# Patient Record
Sex: Female | Born: 1945 | Race: Black or African American | Hispanic: No | Marital: Single | Smoking: Never smoker
Health system: Southern US, Community
[De-identification: ages and names within clinical notes are randomized; demographics above are authoritative.]

## PROBLEM LIST (undated history)

## (undated) DIAGNOSIS — K219 Gastro-esophageal reflux disease without esophagitis: Secondary | ICD-10-CM

## (undated) DIAGNOSIS — I10 Essential (primary) hypertension: Secondary | ICD-10-CM

## (undated) HISTORY — PX: APPENDECTOMY: SHX54

## (undated) HISTORY — PX: KNEE SURGERY: SHX244

## (undated) HISTORY — PX: JOINT REPLACEMENT: SHX530

## (undated) HISTORY — PX: ABDOMINAL HYSTERECTOMY: SHX81

## (undated) HISTORY — PX: TONSILLECTOMY: SUR1361

## (undated) HISTORY — PX: OTHER SURGICAL HISTORY: SHX169

---

## 1998-08-14 HISTORY — PX: OTHER SURGICAL HISTORY: SHX169

## 2004-10-18 ENCOUNTER — Inpatient Hospital Stay: Payer: Self-pay | Admitting: Internal Medicine

## 2004-12-19 ENCOUNTER — Ambulatory Visit: Payer: Self-pay

## 2006-01-24 ENCOUNTER — Ambulatory Visit: Payer: Self-pay

## 2007-02-07 IMAGING — CR DG HAND COMPLETE 3+V*L*
1 series · 3 of 3 positions shown · non-contrast
Comparison: none

REASON FOR EXAM: NUMBNESS,SHOULDER HAND AND RT FOOT
COMMENTS:

[Series 1: view not recorded · 0.17mm/px · 3 of 3 slices shown]
[im 1/3]
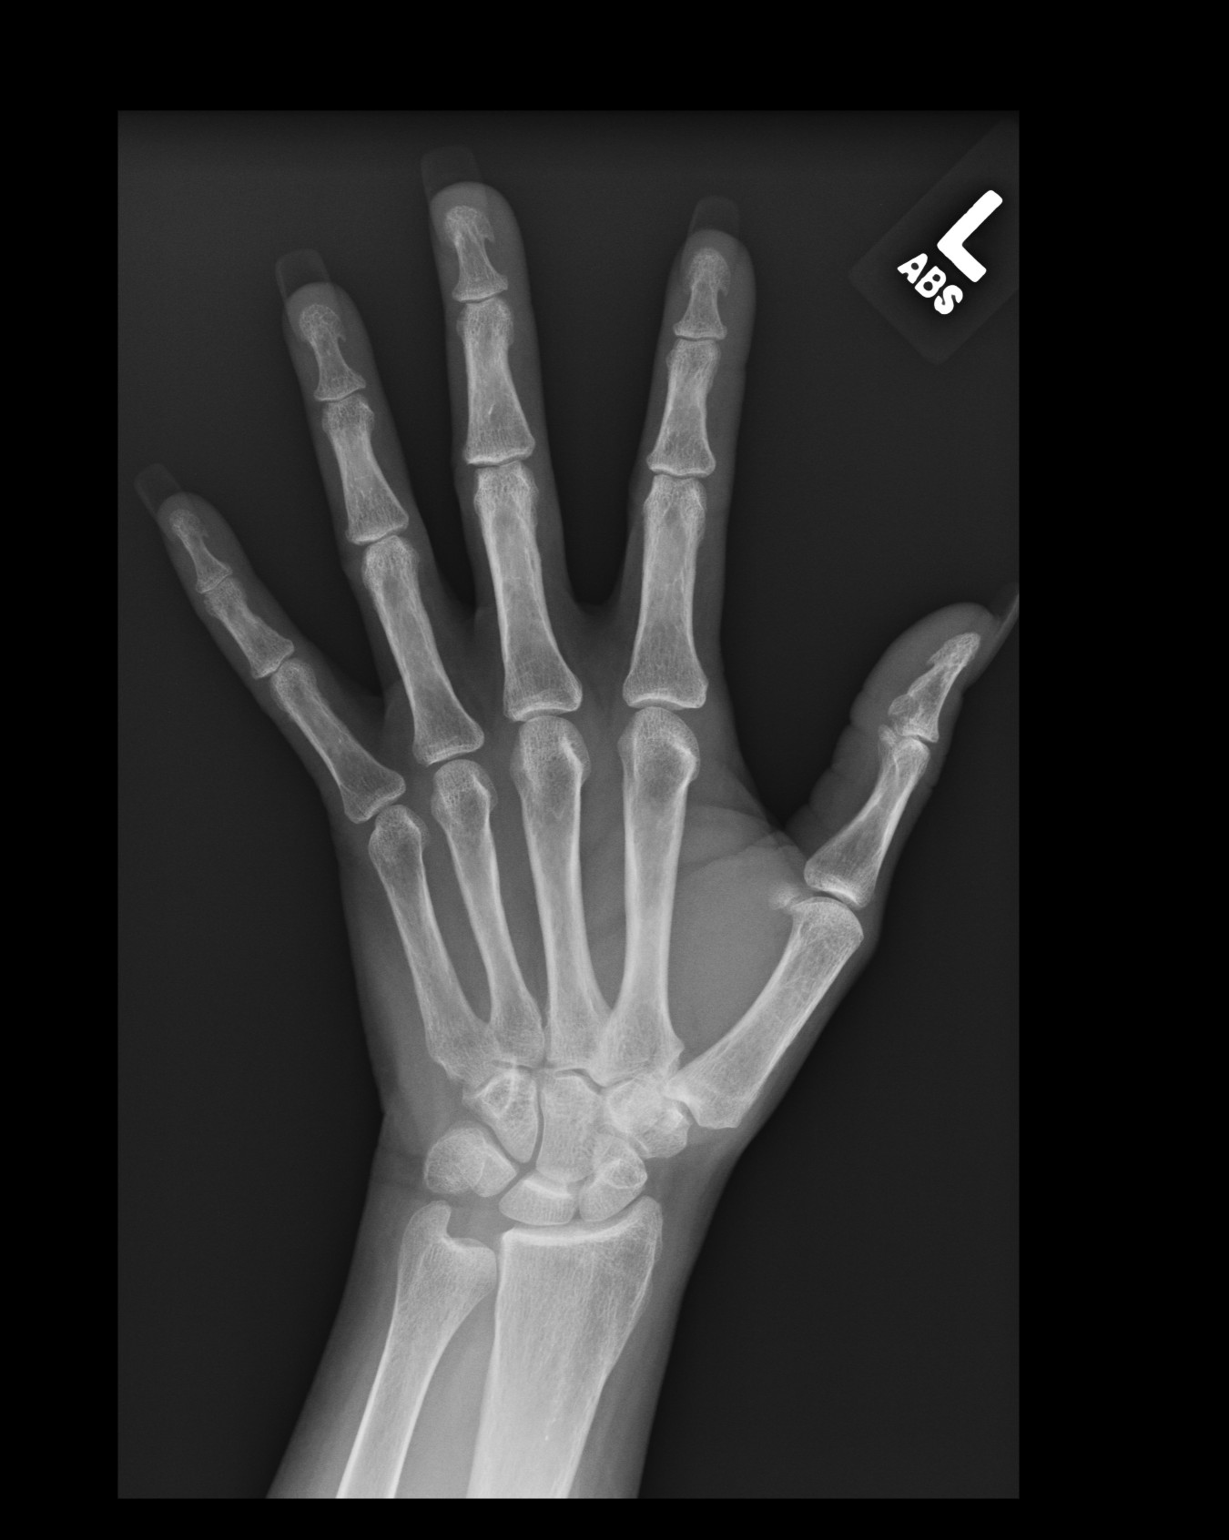
[im 2/3]
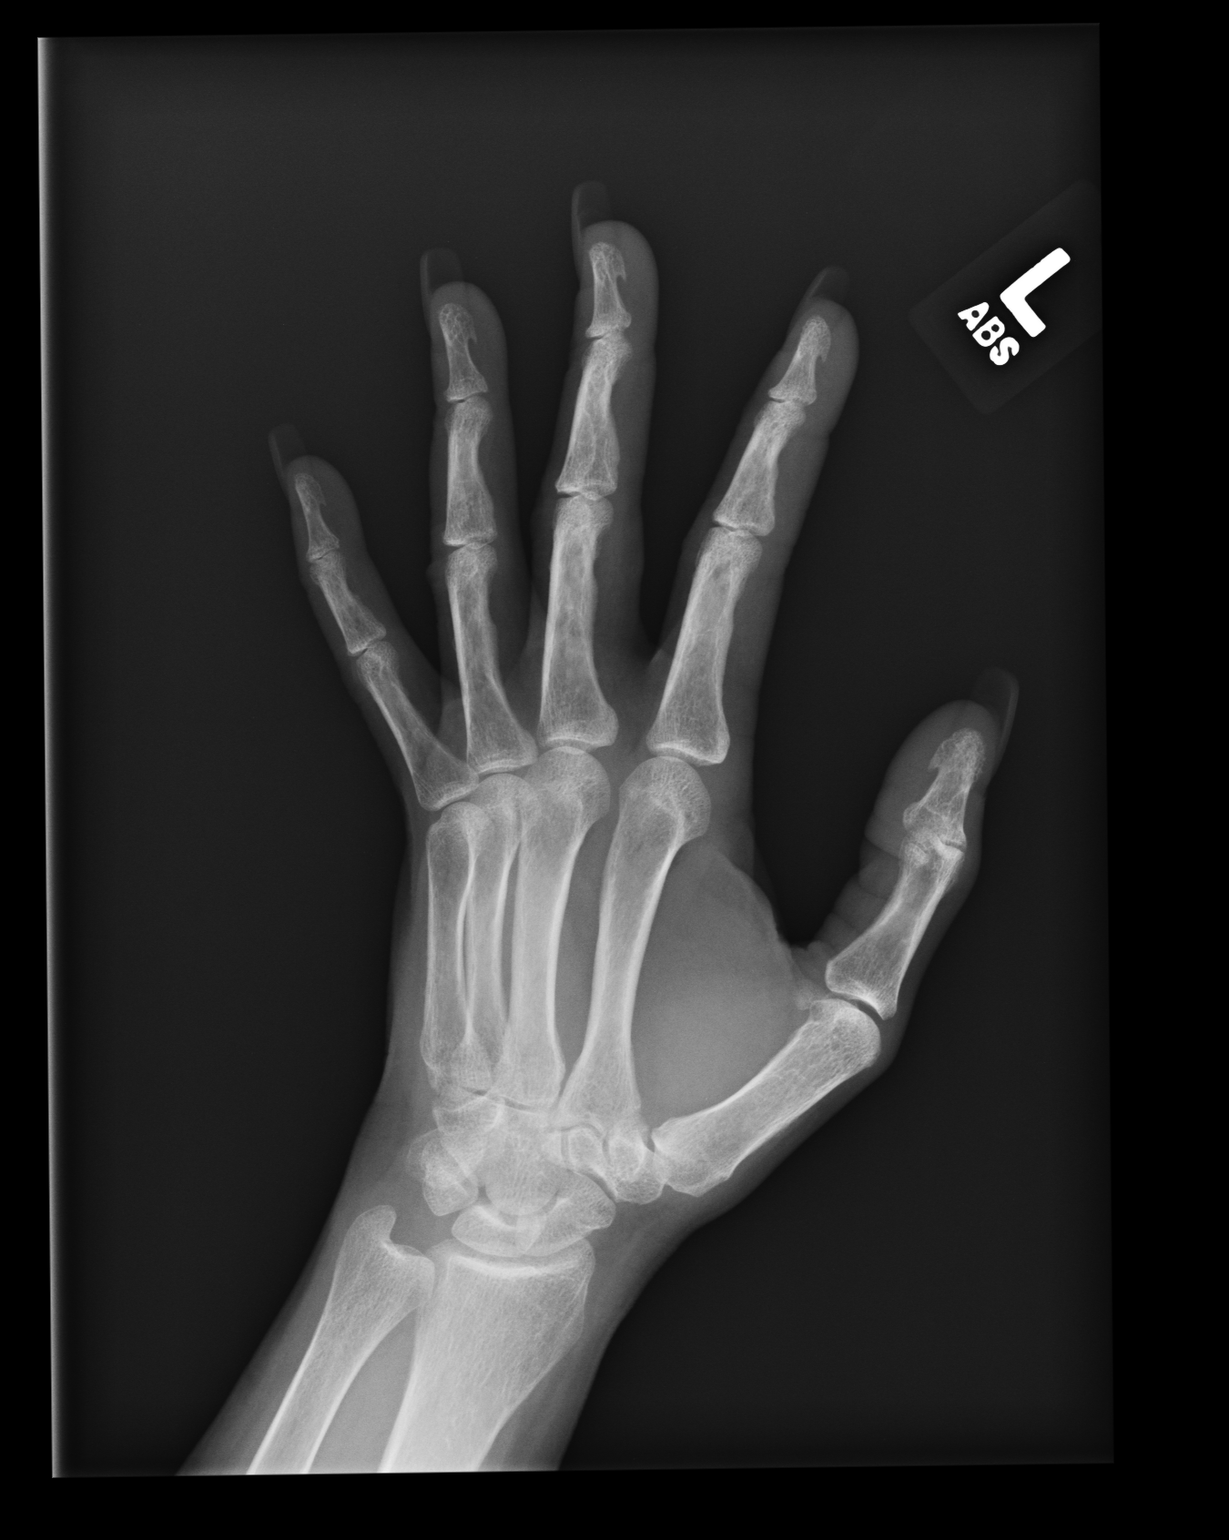
[im 3/3]
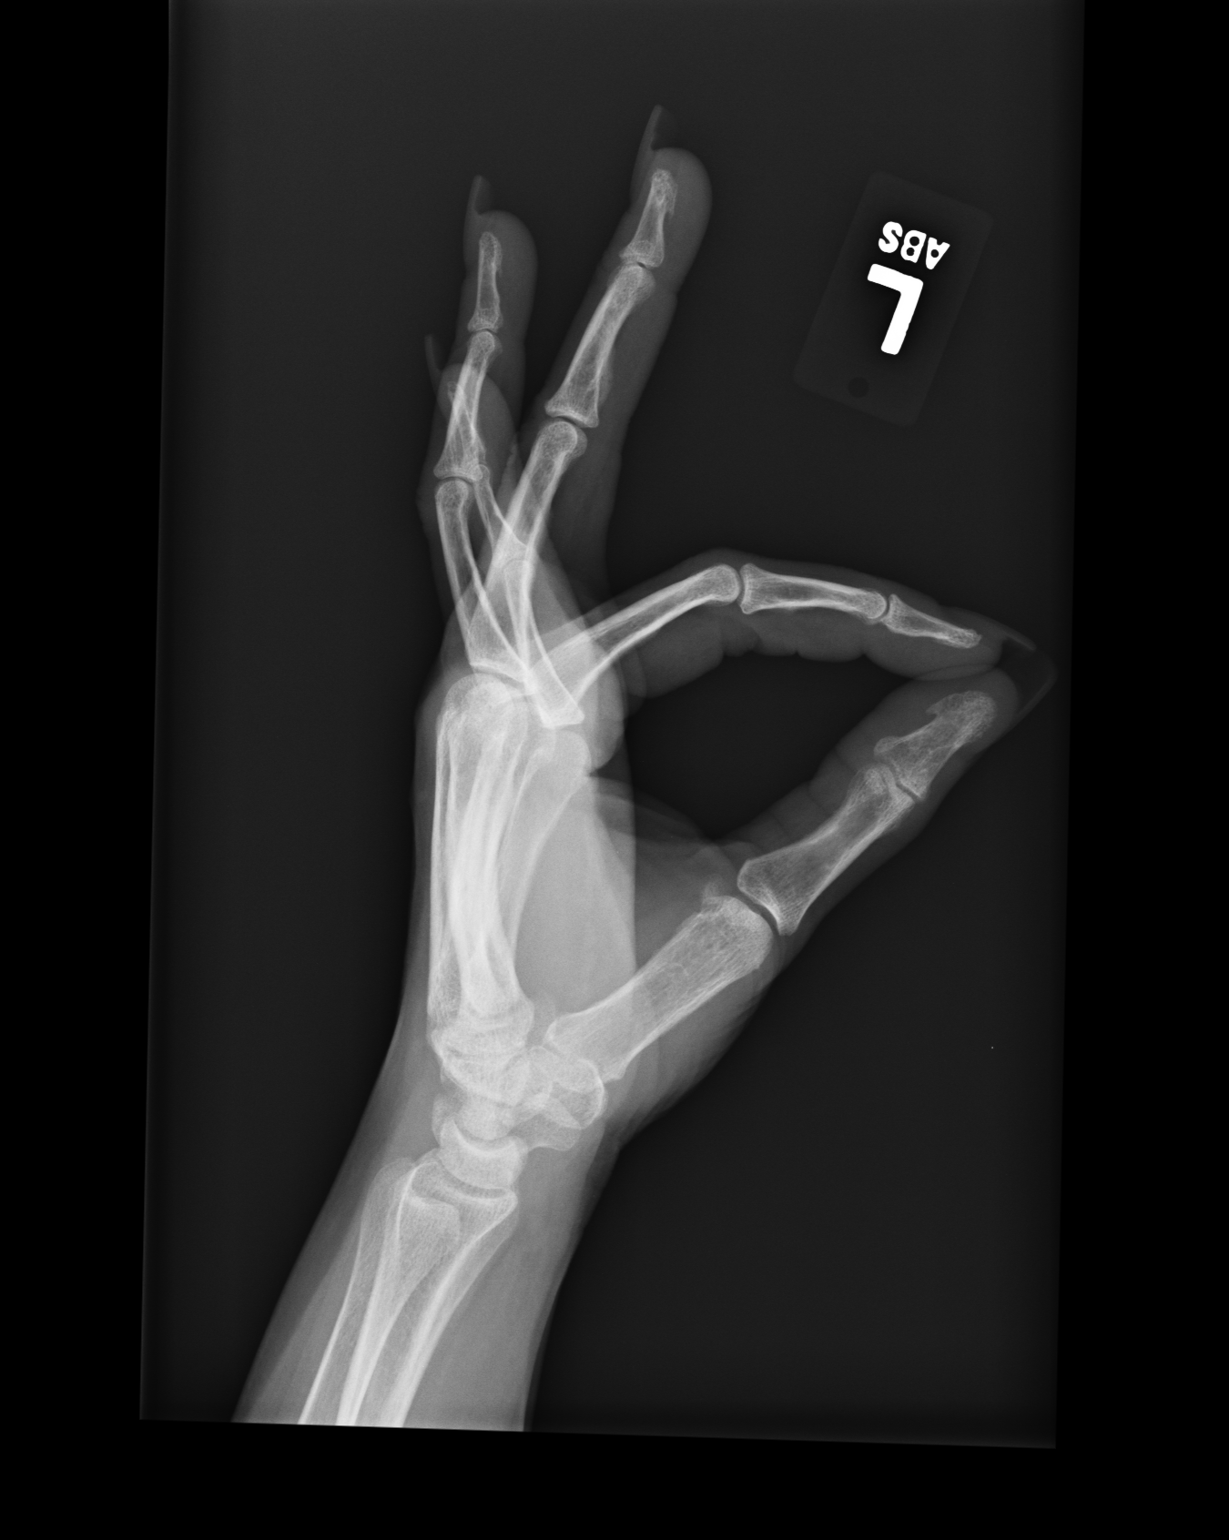

[3 of 3 positions shown; findings below may reference images not displayed]

PROCEDURE:     DXR - DXR HAND LT COMPLETE  W/OBLIQUES  - December 19, 2004 [DATE]

RESULT:        Three views of the LEFT hand reveal the bones to be
adequately mineralized for age.  I see no evidence of acute fracture.  Mild
degenerative joint space narrowing of the interphalangeal joints is seen.
There is no lytic or blastic bony lesion.
IMPRESSION: There are mild age appropriate degenerative changes
present.   I see no acute abnormality.

## 2007-07-02 DIAGNOSIS — K219 Gastro-esophageal reflux disease without esophagitis: Secondary | ICD-10-CM | POA: Insufficient documentation

## 2013-08-14 HISTORY — PX: OTHER SURGICAL HISTORY: SHX169

## 2014-02-11 LAB — CBC WITH AUTOMATED DIFF
ABS. BASOPHILS: 0 10*3/uL (ref 0.0–0.06)
ABS. EOSINOPHILS: 0 10*3/uL (ref 0.0–0.4)
ABS. LYMPHOCYTES: 1.7 10*3/uL (ref 0.9–3.6)
ABS. MONOCYTES: 0.4 10*3/uL (ref 0.05–1.2)
ABS. NEUTROPHILS: 8.6 10*3/uL — ABNORMAL HIGH (ref 1.8–8.0)
BASOPHILS: 0 % (ref 0–2)
EOSINOPHILS: 0 % (ref 0–5)
HCT: 41 % (ref 35.0–45.0)
HGB: 13.7 g/dL (ref 12.0–16.0)
LYMPHOCYTES: 16 % — ABNORMAL LOW (ref 21–52)
MCH: 30.4 PG (ref 24.0–34.0)
MCHC: 33.4 g/dL (ref 31.0–37.0)
MCV: 91.1 FL (ref 74.0–97.0)
MONOCYTES: 4 % (ref 3–10)
MPV: 10.4 FL (ref 9.2–11.8)
NEUTROPHILS: 80 % — ABNORMAL HIGH (ref 40–73)
PLATELET: 236 10*3/uL (ref 135–420)
RBC: 4.5 M/uL (ref 4.20–5.30)
RDW: 14 % (ref 11.6–14.5)
WBC: 10.7 10*3/uL (ref 4.6–13.2)

## 2014-02-11 LAB — URINE MICROSCOPIC ONLY
RBC: 0 /hpf (ref 0–5)
WBC: NEGATIVE /hpf (ref 0–5)

## 2014-02-11 LAB — METABOLIC PANEL, COMPREHENSIVE
A-G Ratio: 0.7 — ABNORMAL LOW (ref 0.8–1.7)
ALT (SGPT): 23 U/L (ref 13–56)
AST (SGOT): 19 U/L (ref 15–37)
Albumin: 3.7 g/dL (ref 3.4–5.0)
Alk. phosphatase: 264 U/L — ABNORMAL HIGH (ref 45–117)
Anion gap: 12 mmol/L (ref 3.0–18)
BUN/Creatinine ratio: 18 (ref 12–20)
BUN: 12 MG/DL (ref 7.0–18)
Bilirubin, total: 0.5 MG/DL (ref 0.2–1.0)
CO2: 25 mmol/L (ref 21–32)
Calcium: 9.8 MG/DL (ref 8.5–10.1)
Chloride: 102 mmol/L (ref 100–108)
Creatinine: 0.67 MG/DL (ref 0.6–1.3)
GFR est AA: >60 mL/min/{1.73_m2} (ref >60)
GFR est non-AA: >60 mL/min/{1.73_m2} (ref >60)
Globulin: 5 g/dL — ABNORMAL HIGH (ref 2.0–4.0)
Glucose: 124 mg/dL — ABNORMAL HIGH (ref 74–99)
Potassium: 4 mmol/L (ref 3.5–5.5)
Protein, total: 8.7 g/dL — ABNORMAL HIGH (ref 6.4–8.2)
Sodium: 139 mmol/L (ref 136–145)

## 2014-02-11 LAB — URINALYSIS W/ RFLX MICROSCOPIC
Bilirubin: NEGATIVE
Glucose: NEGATIVE mg/dL
Ketone: NEGATIVE mg/dL
Leukocyte Esterase: NEGATIVE
Nitrites: NEGATIVE
Protein: >300 mg/dL — AB
Specific gravity: 1.025 (ref 1.003–1.030)
Urobilinogen: 0.2 EU/dL (ref 0.2–1.0)
pH (UA): 7 (ref 5.0–8.0)

## 2014-02-11 LAB — CARDIAC PANEL,(CK, CKMB & TROPONIN)
CK - MB: 0.8 ng/ml (ref 0.5–3.6)
CK-MB Index: 1 % (ref 0.0–4.0)
CK: 80 U/L (ref 26–192)
Troponin-I, QT: <0.02 NG/ML (ref 0.00–0.06)

## 2014-02-11 LAB — LIPASE: Lipase: 157 U/L (ref 73–393)

## 2014-02-11 MED ORDER — PROMETHAZINE 25 MG TAB
25 mg | ORAL_TABLET | Freq: Four times a day (QID) | ORAL | Status: AC | PRN
Start: 2014-02-11 — End: ?

## 2014-02-11 MED ORDER — PROMETHAZINE 25 MG RECTAL SUPPOSITORY
25 mg | Freq: Four times a day (QID) | RECTAL | Status: AC | PRN
Start: 2014-02-11 — End: 2014-02-18

## 2014-02-11 MED ORDER — HYDROCODONE-ACETAMINOPHEN 5 MG-325 MG TAB
5-325 mg | ORAL_TABLET | ORAL | Status: AC | PRN
Start: 2014-02-11 — End: ?

## 2014-02-11 MED ORDER — LABETALOL 5 MG/ML IV SOLN
5 mg/mL | INTRAVENOUS | Status: AC
Start: 2014-02-11 — End: 2014-02-11
  Administered 2014-02-11: 13:00:00 via INTRAVENOUS

## 2014-02-11 MED ORDER — ONDANSETRON (PF) 4 MG/2 ML INJECTION
4 mg/2 mL | INTRAMUSCULAR | Status: AC
Start: 2014-02-11 — End: 2014-02-11
  Administered 2014-02-11: 12:00:00 via INTRAVENOUS

## 2014-02-11 MED ORDER — ONDANSETRON 4 MG TAB, RAPID DISSOLVE
4 mg | ORAL_TABLET | Freq: Three times a day (TID) | ORAL | Status: AC | PRN
Start: 2014-02-11 — End: ?

## 2014-02-11 MED ORDER — FAMOTIDINE (PF) 20 MG/2 ML IV
20 mg/2 mL | INTRAVENOUS | Status: AC
Start: 2014-02-11 — End: 2014-02-11
  Administered 2014-02-11: 12:00:00 via INTRAVENOUS

## 2014-02-11 MED ORDER — RANITIDINE 150 MG TAB
150 mg | ORAL_TABLET | Freq: Two times a day (BID) | ORAL | Status: AC
Start: 2014-02-11 — End: 2014-02-21

## 2014-02-11 MED ADMIN — 0.9% sodium chloride infusion 1,000 mL: INTRAVENOUS | @ 12:00:00 | NDC 00409798309

## 2014-02-11 MED ADMIN — promethazine (PHENERGAN) 12.5 mg in 0.9% sodium chloride 50 mL IVPB: INTRAVENOUS | @ 16:00:00 | NDC 00641092821

## 2014-02-11 MED ADMIN — promethazine (PHENERGAN) 12.5 mg in 0.9% sodium chloride 50 mL IVPB: INTRAVENOUS | @ 14:00:00 | NDC 00641092821

## 2014-02-11 MED FILL — PROMETHAZINE 25 MG/ML INJECTION: 25 mg/mL | INTRAMUSCULAR | Qty: 0.5

## 2014-02-11 MED FILL — LABETALOL 5 MG/ML IV SOLN: 5 mg/mL | INTRAVENOUS | Qty: 20

## 2014-02-11 MED FILL — ONDANSETRON (PF) 4 MG/2 ML INJECTION: 4 mg/2 mL | INTRAMUSCULAR | Qty: 2

## 2014-02-11 MED FILL — SODIUM CHLORIDE 0.9 % IV: INTRAVENOUS | Qty: 1000

## 2014-02-11 MED FILL — FAMOTIDINE (PF) 20 MG/2 ML IV: 20 mg/2 mL | INTRAVENOUS | Qty: 2

## 2014-02-11 NOTE — ED Provider Notes (Signed)
HPI Comments: 7:37 AM   [name] with remote hx of gastric bypass (15 years ago in [place]) who presents to the ED C/O vomiting onset 13 hours ago after eating crab last night. Pt has associated epigastric abdominal pain described as pressure that pt attributes to vomiting episodes. No radiation to back. Reports numerous emesis episodes. + non-bloody diarrhea.  No hematemesis or coffee ground emesis. SurgHx of gastric bypass and hysterectomy. Pt states she has been vomiting on and off ever since her bypass surgery 15 years ago. This occurs 3 times per month. Sxs today are same with no atypical sxs. Pt states she normally will go to ED in [place] and they tx sxs and send patient home. Pt usually takes zofran but pt states she is visiting from [facility] and does not have medication with her. Plans to return home today.  Pt is on medications for her intermittent vomiting. Hx of HTN with last dose of med one day ago. Did not take today. Pt unsure of medication name but has medication in hotel. Pt unsure if she is on PPI or H2 blocker. Takes TUMS prn. No hx of endoscopy for this chronic n/v episodes. No chest pain or SOB.  She denies hx of MI or known cardiac problems and any further symptoms or complaints.     Written by Arvin Collard, ED Scribe, as dictated by Dionne Bucy, PA-C     Patient is a 68 y.o. female presenting with vomiting. The history is provided by the patient. No language interpreter was used.   Vomiting   This is a new problem. The current episode started 12 to 24 hours ago. The problem has not changed since onset.There has been no fever. Associated symptoms include abdominal pain. Pertinent negatives include no chills, no fever, no diarrhea, no headaches, no arthralgias, no cough and no headaches. Her past medical history is significant for gastric bypass.        Past Medical History   Diagnosis Date   ??? Hypertension         Past Surgical History   Procedure Laterality Date   ??? Hx gastric bypass      ??? Hx total abdominal hysterectomy           History reviewed. No pertinent family history.     History     Social History   ??? Marital Status: DIVORCED     Spouse Name: N/A     Number of Children: N/A   ??? Years of Education: N/A     Occupational History   ??? Not on file.     Social History Main Topics   ??? Smoking status: Never Smoker    ??? Smokeless tobacco: Not on file   ??? Alcohol Use: No   ??? Drug Use: Not on file   ??? Sexual Activity: Not on file     Other Topics Concern   ??? Not on file     Social History Narrative   ??? No narrative on file                  ALLERGIES: Percocet      Review of Systems   Constitutional: Positive for appetite change. Negative for fever, chills, fatigue and unexpected weight change.   HENT: Negative for congestion, rhinorrhea and sore throat.    Respiratory: Negative for cough, shortness of breath and wheezing.    Cardiovascular: Negative for chest pain, palpitations and leg swelling.   Gastrointestinal: Positive for  nausea, vomiting and abdominal pain. Negative for diarrhea, constipation and blood in stool.   Genitourinary: Positive for frequency. Negative for dysuria, urgency, hematuria, flank pain and difficulty urinating.   Musculoskeletal: Negative for back pain and arthralgias.   Skin: Negative for rash.   Neurological: Negative for dizziness, syncope, light-headedness and headaches.   Hematological: Negative for adenopathy.       Filed Vitals:    02/11/14 1015 02/11/14 1030 02/11/14 1137 02/11/14 1205   BP: 186/111 188/114 190/109 175/111   Pulse: 87 92 89 94   Temp:       Resp: '18 20 18    ' Height:       Weight:       SpO2: 96% 99% 100%             Physical Exam   Constitutional: She is oriented to person, place, and time. She appears well-developed and well-nourished. No distress.   [name] in NAD. A&Ox4. Empty emesis bag in hand. Daughter at bedside.    HENT:   Head: Normocephalic and atraumatic.   Right Ear: External ear normal.   Left Ear: External ear normal.    Nose: Nose normal.   Mouth/Throat: Oropharynx is clear and moist. No oropharyngeal exudate.   Eyes: Conjunctivae are normal. Right eye exhibits no discharge. Left eye exhibits no discharge. No scleral icterus.   Neck: Normal range of motion. Neck supple.   Cardiovascular: Normal rate, regular rhythm, normal heart sounds and intact distal pulses.  Exam reveals no gallop and no friction rub.    No murmur heard.  Pulmonary/Chest: Effort normal and breath sounds normal. No respiratory distress. She has no wheezes. She has no rales.   Abdominal: Soft. Normal appearance. She exhibits no distension, no ascites and no mass. There is no tenderness. There is no rigidity, no rebound, no guarding, no CVA tenderness, no tenderness at McBurney's point and negative Murphy's sign.   Musculoskeletal: Normal range of motion. She exhibits no edema or tenderness.   Lymphadenopathy:     She has no cervical adenopathy.   Neurological: She is alert and oriented to person, place, and time.   Skin: Skin is warm. No rash noted. She is not diaphoretic.   Psychiatric: She has a normal mood and affect. Judgment normal.   Nursing note and vitals reviewed.     RESULTS:    EKG interpretation: (Preliminary)  NSR at 89 bpm; Possible left atrial enlargement; left ventricular hypertrophy with repolarization abnormality; no previous for comparison  EKG read by Edwena Bunde, MD  at 7:29  Written by Arvin Collard, ED Scribe    No orders to display        Labs Reviewed   URINALYSIS W/ RFLX MICROSCOPIC - Abnormal; Notable for the following:     Protein >300 (*)     Blood TRACE (*)     All other components within normal limits   CBC WITH AUTOMATED DIFF - Abnormal; Notable for the following:     NEUTROPHILS 80 (*)     LYMPHOCYTES 16 (*)     ABS. NEUTROPHILS 8.6 (*)     All other components within normal limits   METABOLIC PANEL, COMPREHENSIVE - Abnormal; Notable for the following:     Glucose 124 (*)     Alk. phosphatase 264 (*)      Protein, total 8.7 (*)     Globulin 5.0 (*)     A-G Ratio 0.7 (*)     All other  components within normal limits   URINE MICROSCOPIC ONLY - Abnormal; Notable for the following:     Bacteria FEW (*)     All other components within normal limits   LIPASE   CARDIAC PANEL,(CK, CKMB & TROPONIN)       Recent Results (from the past 12 hour(s))   EKG, 12 LEAD, INITIAL    Collection Time: 02/11/14  7:29 AM   Result Value Ref Range    Ventricular Rate 89 BPM    Atrial Rate 89 BPM    P-R Interval 156 ms    QRS Duration 100 ms    Q-T Interval 362 ms    QTC Calculation (Bezet) 440 ms    Calculated P Axis 48 degrees    Calculated R Axis -24 degrees    Calculated T Axis 54 degrees    Diagnosis       Normal sinus rhythm  Possible Left atrial enlargement  Left ventricular hypertrophy with repolarization abnormality  Cannot rule out Septal infarct , age undetermined  Abnormal ECG  No previous ECGs available     CBC WITH AUTOMATED DIFF    Collection Time: 02/11/14  7:51 AM   Result Value Ref Range    WBC 10.7 4.6 - 13.2 K/uL    RBC 4.50 4.20 - 5.30 M/uL    HGB 13.7 12.0 - 16.0 g/dL    HCT 41.0 35.0 - 45.0 %    MCV 91.1 74.0 - 97.0 FL    MCH 30.4 24.0 - 34.0 PG    MCHC 33.4 31.0 - 37.0 g/dL    RDW 14.0 11.6 - 14.5 %    PLATELET 236 135 - 420 K/uL    MPV 10.4 9.2 - 11.8 FL    NEUTROPHILS 80 (H) 40 - 73 %    LYMPHOCYTES 16 (L) 21 - 52 %    MONOCYTES 4 3 - 10 %    EOSINOPHILS 0 0 - 5 %    BASOPHILS 0 0 - 2 %    ABS. NEUTROPHILS 8.6 (H) 1.8 - 8.0 K/UL    ABS. LYMPHOCYTES 1.7 0.9 - 3.6 K/UL    ABS. MONOCYTES 0.4 0.05 - 1.2 K/UL    ABS. EOSINOPHILS 0.0 0.0 - 0.4 K/UL    ABS. BASOPHILS 0.0 0.0 - 0.06 K/UL    DF AUTOMATED     METABOLIC PANEL, COMPREHENSIVE    Collection Time: 02/11/14  7:51 AM   Result Value Ref Range    Sodium 139 136 - 145 mmol/L    Potassium 4.0 3.5 - 5.5 mmol/L    Chloride 102 100 - 108 mmol/L    CO2 25 21 - 32 mmol/L    Anion gap 12 3.0 - 18 mmol/L    Glucose 124 (H) 74 - 99 mg/dL    BUN 12 7.0 - 18 MG/DL     Creatinine 0.67 0.6 - 1.3 MG/DL    BUN/Creatinine ratio 18 12 - 20      GFR est AA >60 >60 ml/min/1.81m    GFR est non-AA >60 >60 ml/min/1.776m   Calcium 9.8 8.5 - 10.1 MG/DL    Bilirubin, total 0.5 0.2 - 1.0 MG/DL    ALT 23 13 - 56 U/L    AST 19 15 - 37 U/L    Alk. phosphatase 264 (H) 45 - 117 U/L    Protein, total 8.7 (H) 6.4 - 8.2 g/dL    Albumin 3.7 3.4 - 5.0 g/dL    Globulin 5.0 (  H) 2.0 - 4.0 g/dL    A-G Ratio 0.7 (L) 0.8 - 1.7     LIPASE    Collection Time: 02/11/14  7:51 AM   Result Value Ref Range    Lipase 157 73 - 393 U/L   CARDIAC PANEL,(CK, CKMB & TROPONIN)    Collection Time: 02/11/14  7:51 AM   Result Value Ref Range    CK 80 26 - 192 U/L    CK - MB 0.8 0.5 - 3.6 ng/ml    CK-MB Index 1.0 0.0 - 4.0 %    Troponin-I, Qt. <0.02 0.00 - 0.06 NG/ML   URINALYSIS W/ RFLX MICROSCOPIC    Collection Time: 02/11/14 10:04 AM   Result Value Ref Range    Color YELLOW      Appearance CLEAR      Specific gravity 1.025 1.003 - 1.030      pH (UA) 7.0 5.0 - 8.0      Protein >300 (A) NEG mg/dL    Glucose NEGATIVE  NEG mg/dL    Ketone NEGATIVE  NEG mg/dL    Bilirubin NEGATIVE  NEG      Blood TRACE (A) NEG      Urobilinogen 0.2 0.2 - 1.0 EU/dL    Nitrites NEGATIVE  NEG      Leukocyte Esterase NEGATIVE  NEG     URINE MICROSCOPIC ONLY    Collection Time: 02/11/14 10:04 AM   Result Value Ref Range    WBC NEGATIVE  0 - 5 /hpf    RBC 0 to 3 0 - 5 /hpf    Epithelial cells FEW 0 - 5 /lpf    Bacteria FEW (A) NEG /hpf       MDM  Number of Diagnoses or Management Options  Diagnosis management comments: Viral, food borne, pancreatitis, hepatitis, gastritis, atypical ACS. Doubt AAA       Amount and/or Complexity of Data Reviewed  Clinical lab tests: ordered and reviewed  Tests in the medicine section of CPT??: ordered and reviewed (EKG)  Independent visualization of images, tracings, or specimens: yes (EKG)      MEDICATIONS GIVEN:  Medications   ondansetron (ZOFRAN) injection 4 mg (4 mg IntraVENous Given 02/11/14 0757)    famotidine (PF) (PEPCID) injection 20 mg (20 mg IntraVENous Given 02/11/14 0759)   labetalol (NORMODYNE;TRANDATE) injection 20 mg (20 mg IntraVENous Given 02/11/14 0926)   promethazine (PHENERGAN) 12.5 mg in 0.9% sodium chloride 50 mL IVPB (0 mg IntraVENous IV Completed 02/11/14 1019)   promethazine (PHENERGAN) 12.5 mg in 0.9% sodium chloride 50 mL IVPB (0 mg IntraVENous IV Completed 02/11/14 1206)        Procedures    9:16 AM  Still nauseous. Will given IV phenergan. IV in R AC. Pt did not take BP med today. Will give IV dose of labetalol. Pt denies pain. Labs unremarkable. IVF infusing.     10:50 AM  Pt feeling better with IV Phenergan. Nausea resolved. No emesis in ED. Reassessment of abdomen unchanged. BP improved with IV labetalol in ED. Discussed importance of taking HTN meds when she returns to hotel.     11:41 AM  Pt requests phenergan suppository for home. Will Rx PO, supp, and Zofran ODT. Pt also requests Rx for pain meds.     Work up unremarkable. Non-surgical abdomen/pelvis. Neg Murphy's. No tenderness at McBurney's. Pt reports recurrent n/v since gastric bypass 15 years ago. Presents with same today. Non bloody emesis. No actual emesis in ED. Spitting in  emesis bag. Doubt atypical cardiac. No indication for emergent imaging. Discussed bland diet. Will Rx H2 Blocker as not on antacid. IVF given. Doubt need for emergent imaging as pt reports this is chronic condition occurring 3 times per month for past 15 years. Suspect related to crab meal last night.  FU with PCP upon return home to [facility]. Pt likely will need a non-emergent endoscopy if sxs persist. Pt has known hx of HTN. Did not take BP meds today. Improved with tx in ED. Discussed need for compliance with HTN meds as soon as she returns to hotel. Family plans to drive home today. Reasons to RTED discussed. All questions answered. Pt and her family expressed understanding and they agree with plan.          Chelsea Ellison's  results have been reviewed with her.  She has been counseled regarding her diagnosis, treatment, and plan.  She verbally conveys understanding and agreement of the signs, symptoms, diagnosis, treatment and prognosis and additionally agrees to follow up as discussed.  She also agrees with the care-plan and conveys that all of her questions have been answered.  I have also provided discharge instructions for her that include: educational information regarding their diagnosis and treatment, and list of reasons why they would want to return to the ED prior to their follow-up appointment, should her condition change.      CLINICAL IMPRESSION    1. Nausea with vomiting    2. Diarrhea    3. History of gastric bypass    4. Hypertension, uncontrolled        Follow-up Information    Follow up With Details Comments Contact Info    Your Family Doctor when you get Home       [facility] EMERGENCY DEPT  As needed, If symptoms worsen [address]  [place] [address]  [phone]          Discharge Medication List as of 02/11/2014 10:48 AM      START taking these medications    Details   ondansetron (ZOFRAN ODT) 4 mg disintegrating tablet Take 1 Tab by mouth every eight (8) hours as needed for Nausea., Print, Disp-20 Tab, R-0      promethazine (PHENERGAN) 25 mg tablet Take 1 Tab by mouth every six (6) hours as needed (for nausea or vomiting.)., Print, Disp-20 Tab, R-0      ranitidine (ZANTAC) 150 mg tablet Take 1 Tab by mouth two (2) times a day for 10 days. For acid relux., Print, Disp-20 Tab, R-0         CONTINUE these medications which have NOT CHANGED    Details   potassium chloride SR (KLOR-CON 10) 10 mEq tablet Take 20 mEq by mouth two (2) times a day., Historical Med             Written by Arvin Collard, ED Scribe, as dictated by Dionne Bucy, PA-C     I agree with the above documentation by the scribe.  -- Dionne Bucy, PA-C

## 2014-02-11 NOTE — ED Notes (Signed)
No vomiting noted, patient spitting in emesis bag. Complains of still feeling nauseated. Ambulatory to restroom with minimal assistance.

## 2014-02-11 NOTE — ED Notes (Signed)
Attempted to discharge patient, family member with patient states patient just vomited. Asked was it clear and patient spitting, family member states "no it was vomit" after she asked patient did she vomit. Patient has been spitting in an emesis bag during her entire visit, no actual vomiting noted. Physician assistant made aware of patients "vomiting" episodes. Report given to Cyd SilenceKelly B RN.

## 2014-02-11 NOTE — ED Notes (Signed)
Chelsea IvanLiz RN attempted to discharge patient. This rn was informed that Patient continues to vomit. Chelsea IvanLiz RN informed provider and provider is placing order for more phenergan for vomiting.  Ass[facility] care of [facility].

## 2014-02-11 NOTE — ED Notes (Signed)
"  I have been vomiting since 6pm and I am having pain in my chest from throwing up."

## 2014-02-11 NOTE — ED Notes (Signed)
I have reviewed discharge instructions with the patient.  The patient verbalized understanding.  Patient armband removed and shredded. Pt sister at bedside to drive patient home.

## 2014-02-11 NOTE — ED Notes (Signed)
IV fluids continue to run. Patient reports feeling slightly better, no vomiting noted.

## 2014-02-11 NOTE — ED Notes (Signed)
Patient is a very poor historian, unable to say what surgeries or health problems she has other than she had gastric bypass "many years ago" and has had problems since that time.

## 2014-02-11 NOTE — ED Notes (Signed)
Patient medicated for vomiting. Pt gives this rn permission to speak with her sister who is at the bedside . Pt sister requesting patient to be discharged home with rectal suppository medication for nausea.  Provider made aware and provider approves patient to be discharged with currently vital signs and patient is to take her blood pressure medication at home.

## 2014-02-13 LAB — EKG, 12 LEAD, INITIAL
Atrial Rate: 89 {beats}/min
Calculated P Axis: 48 degrees
Calculated R Axis: -24 degrees
Calculated T Axis: 54 degrees
Diagnosis: NORMAL
P-R Interval: 156 ms
Q-T Interval: 362 ms
QRS Duration: 100 ms
QTC Calculation (Bezet): 440 ms
Ventricular Rate: 89 {beats}/min

## 2014-04-09 ENCOUNTER — Ambulatory Visit: Payer: Self-pay | Admitting: Gastroenterology

## 2014-04-10 LAB — PATHOLOGY REPORT

## 2014-05-26 ENCOUNTER — Ambulatory Visit: Payer: Self-pay | Admitting: Anesthesiology

## 2014-05-26 LAB — CBC WITH DIFFERENTIAL/PLATELET
BASOS ABS: 0 10*3/uL (ref 0.0–0.1)
Basophil %: 0.6 %
EOS ABS: 0.1 10*3/uL (ref 0.0–0.7)
Eosinophil %: 1.6 %
HCT: 35.3 % (ref 35.0–47.0)
HGB: 11.4 g/dL — ABNORMAL LOW (ref 12.0–16.0)
Lymphocyte #: 2.5 10*3/uL (ref 1.0–3.6)
Lymphocyte %: 32.6 %
MCH: 29.9 pg (ref 26.0–34.0)
MCHC: 32.2 g/dL (ref 32.0–36.0)
MCV: 93 fL (ref 80–100)
Monocyte #: 0.5 x10 3/mm (ref 0.2–0.9)
Monocyte %: 6.5 %
NEUTROS ABS: 4.6 10*3/uL (ref 1.4–6.5)
Neutrophil %: 58.7 %
Platelet: 249 10*3/uL (ref 150–440)
RBC: 3.81 10*6/uL (ref 3.80–5.20)
RDW: 14.1 % (ref 11.5–14.5)
WBC: 7.7 10*3/uL (ref 3.6–11.0)

## 2014-05-26 LAB — POTASSIUM: POTASSIUM: 3.9 mmol/L (ref 3.5–5.1)

## 2014-05-29 ENCOUNTER — Ambulatory Visit: Payer: Self-pay | Admitting: Podiatry

## 2014-12-05 NOTE — Op Note (Signed)
PATIENT NAME:  Priscilla Savage, Priscilla Savage MR#:  [id number] DATE OF BIRTH:  1946-07-15  DATE OF PROCEDURE:  05/29/2014  PREOPERATIVE DIAGNOSES:  1. Hammertoe, right second toe. 2. Exostosis, right fifth toe.   POSTOPERATIVE DIAGNOSES:  1. Hammertoe, right second toe. 2.   Exostosis, right fifth toe.  PROCEDURES: 1. Hammertoe correction, right second toe, with K wire fixation.  2. Arthroplasty, right fifth toe.   SURGEON: Linus Galasodd Kristiana Jacko, DPM.  ANESTHESIA: Local MAC.   HEMOSTASIS: Pneumatic tourniquet, right ankle, 250 mmHg.   ESTIMATED BLOOD LOSS: Minimal.   MATERIALS: One 0.045 inch K wire.   COMPLICATIONS: None apparent.   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 She elects for surgical correction.   OPERATIVE PROCEDURE: The patient was taken to the operating room and placed on the table in the supine position. Following satisfactory sedation, the right foot was anesthetized with 10 mL of 0.5% bupivacaine plain around the second and fifth toe areas as well as the second metatarsal. A pneumatic tourniquet was applied at the level of the right ankle and the foot was prepped and draped in the usual sterile fashion. The foot was exsanguinated and the tourniquet inflated to 250 mmHg. Attention was then directed to the dorsal aspect of the right foot, where an approximate 5 cm linear incision was made coursing distal to proximal over the second toe and second metatarsal. The incision was deepened via sharp and blunt dissection down to the level of the joint at the proximal interphalangeal joint as well as the metatarsophalangeal joint. The extensor hood apparatus was released from the joint and a dorsal capsulotomy was performed. There still noted to be some contractures at the metatarsophalangeal joint and extensor tendon Z-lengthening was performed.   At this point, attention was directed distally where a transverse tenotomy was performed at  the proximal interphalangeal joint and the capsular and periosteal tissues reflected off the head of the proximal phalanx, which was then resected in toto using a pneumatic saw. At this point, there was noted to be good release of the contractures on the second toe. The wound was flushed with copious amounts of sterile saline. A 0.045 inch K wire was then driven through the middle and distal phalanx, through the tip of the toe and then retrograded through the proximal phalanx and crossing into the second metatarsal. Intraoperative FluoroScan views revealed good alignment of the second toe and K wire placement. The wound was again flushed with copious amounts of sterile saline and closed using 4-0 Vicryl running suture for all layers from capsular and periosteal tissues to deep and superficial subcutaneous and skin closure. Three 5-0 nylon simple interrupted sutures were then placed over the toe as well.    Attention was then directed to the right fifth toe, where an approximate 1.5 cm linear incision was made over the proximal interphalangeal joint of the fifth toe. The incision was deepened via sharp and blunt dissection down to the level of the joint, where a transverse tenotomy was performed. The capsular and periosteal tissues reflected off the head of the proximal phalanx, which was then resected in toto. Intraoperative FluoroScan views revealed good reduction of the deformity. The wound was flushed with copious amounts of sterile saline and closed using 4-0 Vicryl horizontal mattress for tendon reapproximation followed by skin closure using 5-0 nylon simple interrupted sutures. Tincture of benzoin and Steri-Strips were applied to the second metatarsal incision followed by Xeroform and sterile bandages to  both incisions and the foot.   The tourniquet was released and blood flow noted to return immediately to the left foot in digits 1 through 5. The patient tolerated the procedure and anesthesia well and was  transported to the PACU with vital signs stable and in good condition.     ____________________________ Linus Galas, DPM tc:TT D: 05/29/2014 17:17:31 ET T: 05/29/2014 18:17:09 ET JOB#: [id number]  cc: Linus Galas, DPM, <Dictator> Javari Bufkin DPM ELECTRONICALLY SIGNED 06/08/2014 11:05

## 2017-06-28 DIAGNOSIS — Z9884 Bariatric surgery status: Secondary | ICD-10-CM | POA: Insufficient documentation

## 2017-10-10 DIAGNOSIS — E559 Vitamin D deficiency, unspecified: Secondary | ICD-10-CM | POA: Insufficient documentation

## 2017-10-10 DIAGNOSIS — E538 Deficiency of other specified B group vitamins: Secondary | ICD-10-CM | POA: Insufficient documentation

## 2018-01-10 DIAGNOSIS — E042 Nontoxic multinodular goiter: Secondary | ICD-10-CM | POA: Insufficient documentation

## 2018-03-04 DIAGNOSIS — R440 Auditory hallucinations: Secondary | ICD-10-CM | POA: Insufficient documentation

## 2018-03-08 ENCOUNTER — Other Ambulatory Visit: Payer: Self-pay | Admitting: Neurology

## 2018-03-08 DIAGNOSIS — R440 Auditory hallucinations: Secondary | ICD-10-CM

## 2018-03-21 ENCOUNTER — Ambulatory Visit
Admission: RE | Admit: 2018-03-21 | Discharge: 2018-03-21 | Disposition: A | Discharge status: Home / Self Care | Payer: Medicare HMO | Source: Ambulatory Visit | Attending: Neurology | Admitting: Neurology

## 2018-03-21 ENCOUNTER — Encounter: Payer: Self-pay | Admitting: Radiology

## 2018-03-21 DIAGNOSIS — R440 Auditory hallucinations: Secondary | ICD-10-CM

## 2018-03-21 DIAGNOSIS — R200 Anesthesia of skin: Secondary | ICD-10-CM | POA: Diagnosis present

## 2018-03-21 DIAGNOSIS — I6782 Cerebral ischemia: Secondary | ICD-10-CM | POA: Insufficient documentation

## 2018-03-21 MED ORDER — GADOBENATE DIMEGLUMINE 529 MG/ML IV SOLN
20.0000 mL | Freq: Once | INTRAVENOUS | Status: AC | PRN
  Administered 2018-03-21: 20 mL via INTRAVENOUS

## 2020-04-22 DIAGNOSIS — M179 Osteoarthritis of knee, unspecified: Secondary | ICD-10-CM | POA: Insufficient documentation

## 2020-04-22 DIAGNOSIS — E213 Hyperparathyroidism, unspecified: Secondary | ICD-10-CM | POA: Insufficient documentation

## 2020-12-02 HISTORY — PX: TOTAL KNEE ARTHROPLASTY: SHX125

## 2021-07-14 DIAGNOSIS — R42 Dizziness and giddiness: Secondary | ICD-10-CM

## 2021-07-14 HISTORY — DX: Dizziness and giddiness: R42

## 2021-09-12 ENCOUNTER — Ambulatory Visit: Payer: Self-pay | Admitting: "Endocrinology

## 2022-06-26 ENCOUNTER — Encounter: Payer: Self-pay | Admitting: Orthopedic Surgery

## 2022-07-20 ENCOUNTER — Encounter: Payer: Self-pay | Admitting: Orthopedic Surgery

## 2022-07-24 ENCOUNTER — Encounter: Payer: Self-pay | Admitting: Orthopedic Surgery

## 2022-07-24 ENCOUNTER — Other Ambulatory Visit: Payer: Self-pay | Admitting: Orthopedic Surgery

## 2022-07-24 ENCOUNTER — Ambulatory Visit (INDEPENDENT_AMBULATORY_CARE_PROVIDER_SITE_OTHER): Payer: Medicare HMO | Admitting: Orthopedic Surgery

## 2022-07-24 ENCOUNTER — Ambulatory Visit (INDEPENDENT_AMBULATORY_CARE_PROVIDER_SITE_OTHER): Payer: Medicare HMO

## 2022-07-24 VITALS — BP 180/105 | HR 80 | Ht 67.0 in | Wt 212.0 lb

## 2022-07-24 DIAGNOSIS — M25562 Pain in left knee: Secondary | ICD-10-CM | POA: Diagnosis not present

## 2022-07-24 DIAGNOSIS — Z96652 Presence of left artificial knee joint: Secondary | ICD-10-CM

## 2022-07-24 DIAGNOSIS — G8929 Other chronic pain: Secondary | ICD-10-CM

## 2022-07-24 DIAGNOSIS — I10 Essential (primary) hypertension: Secondary | ICD-10-CM | POA: Insufficient documentation

## 2022-07-24 NOTE — Patient Instructions (Addendum)
Reminder: This is a second opinion only.  Will not be taking over care for her current problem.  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 Romeo Apple will call you with the results of the blood work  We will make the referral for the revision specialist

## 2022-07-24 NOTE — Progress Notes (Signed)
Chief Complaint  Patient presents with   Knee Pain    Left second opinion DOS 12/02/20    HPI: This is a second opinion regarding the left total knee performed in [place] on December 02, 2020  I have notes from Dr. Leary Roca which indicated this is a tune DePuy total knee with an 8 femur right tibia 6 polyethylene PS rotating platform total knee  The patient presented with images from the left knee this looks like a DePuy left total knee with images showing tilt of the patella otherwise normal alignment and no loosening these x-rays are  dated January 27, 2021  She reports 9 out of 10 pain  She notes that she could bend her knee further than she could bend it now prior to the surgery  She reports that the knee gave out prior to surgery she was having pain and she was having difficulty walking and some stiffness  She was active and scuba diving and dancing  She complains that she cannot lift her leg enough to get into her shower and she can no longer swim or dance   Notes from primary care indicate the knee was injected on May 15, 2022   She listed her review of systems as the following pain in legs after walking, urgency, joint pain, depression, easy bruising, denies any back pain pre or postsurgery   Listed medical problems include hyperparathyroidism, hypertension, status post bariatric surgery, vitamin D deficiency, B12 deficiency and esophageal reflux     Current medications are amlodipine 10 mg hydrochlorothiazide 12.5 mg Cozaar 100 mg multivitamin and Ditropan XL once a day 5 mg for 90 days   PHYSICAL EXAM  BP (!) 180/105   Pulse 80   Ht 5\' 7"  (1.702 m)   Wt 212 lb (96.2 kg)   BMI 33.20 kg/m    General appearance: Well-developed well-nourished no gross deformities  Cardiovascular normal pulse and perfusion normal color;   with MILD PRE tibial edema  Neurologically no sensation loss or deficits or pathologic reflexes  Psychological: Awake alert and  oriented x3 mood and affect normal  Skin no lacerations or ulcerations no nodularity no palpable masses, no erythema or nodularity  Musculoskeletal: The patient is ambulatory with a walker but she has decreased stride length and decreased cadence  She has an adequate flexion of the joint  The incision healed well.  She is globally tender the knee is warm to touch.  Her range of motion is 0-60 passive and 0-60 active  She can extend the knee to full extension on her own    Imaging the previous films noted in the history and physical show no malalignment and no evidence of loosening  Today's imaging shows  A/P  Differential diagnoses arthrofibrosis  Infection  Unbalanced flexion extension gaps  Recommend blood work CBC sed rate C-reactive protein rule out infection.  There is no effusion so no aspiration was done  The patient should be referred to revision specialist for possible revision left total knee  Addendum the patient's surgical notes indicate that an 11 mm distal femur resection 5 degree valgus alignment and 4 mm medial tibial resection was performed

## 2022-07-25 LAB — CBC WITH DIFFERENTIAL/PLATELET
Absolute Monocytes: 328 cells/uL (ref 200–950)
Basophils Absolute: 19 cells/uL (ref 0–200)
Basophils Relative: 0.3 %
Eosinophils Absolute: 32 cells/uL (ref 15–500)
Eosinophils Relative: 0.5 %
HCT: 34.3 % — ABNORMAL LOW (ref 35.0–45.0)
Hemoglobin: 11.1 g/dL — ABNORMAL LOW (ref 11.7–15.5)
Lymphs Abs: 2419 cells/uL (ref 850–3900)
MCH: 30.2 pg (ref 27.0–33.0)
MCHC: 32.4 g/dL (ref 32.0–36.0)
MCV: 93.2 fL (ref 80.0–100.0)
MPV: 10.7 fL (ref 7.5–12.5)
Monocytes Relative: 5.2 %
Neutro Abs: 3503 cells/uL (ref 1500–7800)
Neutrophils Relative %: 55.6 %
Platelets: 245 10*3/uL (ref 140–400)
RBC: 3.68 10*6/uL — ABNORMAL LOW (ref 3.80–5.10)
RDW: 12.6 % (ref 11.0–15.0)
Total Lymphocyte: 38.4 %
WBC: 6.3 10*3/uL (ref 3.8–10.8)

## 2022-07-25 LAB — C-REACTIVE PROTEIN: CRP: 3 mg/L (ref <8.0)

## 2022-07-25 LAB — SEDIMENTATION RATE: Sed Rate: 34 mm/h — ABNORMAL HIGH (ref 0–30)

## 2022-07-27 ENCOUNTER — Telehealth: Payer: Self-pay | Admitting: Orthopedic Surgery

## 2022-07-27 NOTE — Telephone Encounter (Signed)
CBC sed rate C-reactive protein does not indicate likely infection  Results relayed to the patient  Request has been put in for patient to see joint replacement specialist for possible revision

## 2022-08-01 ENCOUNTER — Telehealth: Payer: Self-pay | Admitting: Radiology

## 2022-08-01 DIAGNOSIS — G8929 Other chronic pain: Secondary | ICD-10-CM

## 2022-08-01 NOTE — Telephone Encounter (Signed)
Patient asking about referral Where are you referring?

## 2022-08-01 NOTE — Telephone Encounter (Signed)
Patient called and I could not understand the VM.  I think she is asking about a referral to another doctor.  Asks for a call, thanks.

## 2022-08-02 ENCOUNTER — Telehealth: Payer: Self-pay | Admitting: Radiology

## 2022-08-02 ENCOUNTER — Encounter: Payer: Self-pay | Admitting: Orthopedic Surgery

## 2022-08-02 NOTE — Telephone Encounter (Signed)
I will send to [facility] and ask for revision specialist. Thanks

## 2022-08-02 NOTE — Telephone Encounter (Signed)
  Recommend blood work CBC sed rate C-reactive protein rule out infection.  There is no effusion so no aspiration was done  The patient should be referred to revision specialist for possible revision left total knee  Addendum the patient's surgical notes indicate that an 11 mm distal femur resection 5 degree valgus alignment and 4 mm medial tibial resection was performed

## 2022-08-02 NOTE — Addendum Note (Signed)
Addended byCaffie Damme on: 08/02/2022 08:20 AM   Modules accepted: Orders

## 2022-08-02 NOTE — Telephone Encounter (Signed)
Dr Romeo Apple had me refer for second opinion I will send records from here to [facility] she will need to have her surgeon send records as well, Dr Romeo Apple did not do the surgery  I called her to let her know.   [phone] is the fax number [phone] is the phone number I gave her the information and she will have records from [place] sent over and she will call for appointment if she has not heard anything in the next week.

## 2022-10-12 ENCOUNTER — Encounter: Payer: Self-pay | Admitting: Radiology

## 2022-10-30 ENCOUNTER — Encounter: Payer: Self-pay | Admitting: Ophthalmology

## 2022-11-02 NOTE — Discharge Instructions (Signed)

## 2022-11-06 ENCOUNTER — Ambulatory Visit: Payer: 59 | Admitting: Anesthesiology

## 2022-11-06 ENCOUNTER — Other Ambulatory Visit: Payer: Self-pay

## 2022-11-06 ENCOUNTER — Encounter
Admission: RE | Disposition: A | Discharge status: Home / Self Care | Payer: Self-pay | Source: Home / Self Care | Attending: Ophthalmology

## 2022-11-06 ENCOUNTER — Encounter: Payer: Self-pay | Admitting: Ophthalmology

## 2022-11-06 ENCOUNTER — Ambulatory Visit
Admission: RE | Admit: 2022-11-06 | Discharge: 2022-11-06 | Disposition: A | Discharge status: Home / Self Care | Payer: 59 | Attending: Ophthalmology | Admitting: Ophthalmology

## 2022-11-06 DIAGNOSIS — K219 Gastro-esophageal reflux disease without esophagitis: Secondary | ICD-10-CM | POA: Diagnosis not present

## 2022-11-06 DIAGNOSIS — I10 Essential (primary) hypertension: Secondary | ICD-10-CM | POA: Insufficient documentation

## 2022-11-06 DIAGNOSIS — H2511 Age-related nuclear cataract, right eye: Secondary | ICD-10-CM | POA: Insufficient documentation

## 2022-11-06 DIAGNOSIS — Z79899 Other long term (current) drug therapy: Secondary | ICD-10-CM | POA: Diagnosis not present

## 2022-11-06 DIAGNOSIS — Z9884 Bariatric surgery status: Secondary | ICD-10-CM | POA: Insufficient documentation

## 2022-11-06 HISTORY — PX: CATARACT EXTRACTION W/PHACO: SHX586

## 2022-11-06 HISTORY — DX: Essential (primary) hypertension: I10

## 2022-11-06 HISTORY — DX: Gastro-esophageal reflux disease without esophagitis: K21.9

## 2022-11-06 SURGERY — PHACOEMULSIFICATION, CATARACT, WITH IOL INSERTION
Anesthesia: Monitor Anesthesia Care | Site: Eye | Laterality: Right

## 2022-11-06 MED ORDER — TETRACAINE HCL 0.5 % OP SOLN
1.0000 [drp] | OPHTHALMIC | Status: DC | PRN
  Administered 2022-11-06 (×3): 1 [drp] via OPHTHALMIC

## 2022-11-06 MED ORDER — LIDOCAINE HCL (PF) 2 % IJ SOLN
INTRAOCULAR | Status: DC | PRN
  Administered 2022-11-06: 1 mL via INTRAOCULAR

## 2022-11-06 MED ORDER — SIGHTPATH DOSE#1 BSS IO SOLN
INTRAOCULAR | Status: DC | PRN
  Administered 2022-11-06: 15 mL

## 2022-11-06 MED ORDER — ARMC OPHTHALMIC DILATING DROPS
1.0000 | OPHTHALMIC | Status: DC | PRN
  Administered 2022-11-06 (×3): 1 via OPHTHALMIC

## 2022-11-06 MED ORDER — MIDAZOLAM HCL 2 MG/2ML IJ SOLN
INTRAMUSCULAR | Status: DC | PRN
  Administered 2022-11-06 (×2): 1 mg via INTRAVENOUS

## 2022-11-06 MED ORDER — BRIMONIDINE TARTRATE-TIMOLOL 0.2-0.5 % OP SOLN
OPHTHALMIC | Status: DC | PRN
  Administered 2022-11-06: 1 [drp] via OPHTHALMIC

## 2022-11-06 MED ORDER — SIGHTPATH DOSE#1 BSS IO SOLN
INTRAOCULAR | Status: DC | PRN
  Administered 2022-11-06: 70 mL via OPHTHALMIC

## 2022-11-06 MED ORDER — LACTATED RINGERS IV SOLN: INTRAVENOUS | Status: DC

## 2022-11-06 MED ORDER — FENTANYL CITRATE (PF) 100 MCG/2ML IJ SOLN
INTRAMUSCULAR | Status: DC | PRN
  Administered 2022-11-06: 50 ug via INTRAVENOUS

## 2022-11-06 MED ORDER — SIGHTPATH DOSE#1 NA HYALUR & NA CHOND-NA HYALUR IO KIT
PACK | INTRAOCULAR | Status: DC | PRN
  Administered 2022-11-06: 1 via OPHTHALMIC

## 2022-11-06 MED ORDER — MOXIFLOXACIN HCL 0.5 % OP SOLN
OPHTHALMIC | Status: DC | PRN
  Administered 2022-11-06: .2 mL via OPHTHALMIC

## 2022-11-06 SURGICAL SUPPLY — 22 items
CANNULA ANT/CHMB 27G (MISCELLANEOUS) IMPLANT
CANNULA ANT/CHMB 27GA (MISCELLANEOUS) IMPLANT
CATARACT SUITE SIGHTPATH (MISCELLANEOUS) ×1 IMPLANT
DISSECTOR HYDRO NUCLEUS 50X22 (MISCELLANEOUS) ×1 IMPLANT
DRSG TEGADERM 2-3/8X2-3/4 SM (GAUZE/BANDAGES/DRESSINGS) ×1 IMPLANT
FEE CATARACT SUITE SIGHTPATH (MISCELLANEOUS) ×1 IMPLANT
GLOVE SURG SYN 7.5  E (GLOVE) ×1
GLOVE SURG SYN 7.5 E (GLOVE) ×1 IMPLANT
GLOVE SURG SYN 7.5 PF PI (GLOVE) ×1 IMPLANT
GLOVE SURG SYN 8.5  E (GLOVE) ×1
GLOVE SURG SYN 8.5 E (GLOVE) ×1 IMPLANT
GLOVE SURG SYN 8.5 PF PI (GLOVE) ×1 IMPLANT
LENS IOL TECNIS EYHANCE 22.5 (Intraocular Lens) IMPLANT
NDL FILTER BLUNT 18X1 1/2 (NEEDLE) IMPLANT
NEEDLE FILTER BLUNT 18X1 1/2 (NEEDLE) IMPLANT
PACK VIT ANT 23G (MISCELLANEOUS) IMPLANT
RING MALYGIN 7.0 (MISCELLANEOUS) IMPLANT
SUT ETHILON 10-0 CS-B-6CS-B-6 (SUTURE)
SUTURE EHLN 10-0 CS-B-6CS-B-6 (SUTURE) IMPLANT
SYR 3ML LL SCALE MARK (SYRINGE) IMPLANT
SYR 5ML LL (SYRINGE) IMPLANT
WATER STERILE IRR 250ML POUR (IV SOLUTION) ×1 IMPLANT

## 2022-11-06 NOTE — Anesthesia Preprocedure Evaluation (Signed)
Anesthesia Evaluation  Patient identified by MRN, date of birth, ID band Patient awake    Reviewed: Allergy & Precautions, NPO status , Patient's Chart, lab work & pertinent test results  Airway Mallampati: III  TM Distance: >3 FB Neck ROM: full    Dental  (+) Chipped, Dental Advidsory Given   Pulmonary neg pulmonary ROS, neg COPD   Pulmonary exam normal        Cardiovascular hypertension, On Medications negative cardio ROS Normal cardiovascular exam     Neuro/Psych negative neurological ROS  negative psych ROS   GI/Hepatic Neg liver ROS,GERD  Medicated,,  Endo/Other  negative endocrine ROS    Renal/GU      Musculoskeletal   Abdominal   Peds  Hematology negative hematology ROS (+)   Anesthesia Other Findings Past Medical History: No date: GERD (gastroesophageal reflux disease) No date: Hypertension 07/2021: Vertigo     Comment:  had epley Maneuver.  None since  Past Surgical History: No date: ABDOMINAL HYSTERECTOMY No date: APPENDECTOMY No date: eyelid surgery; Bilateral No date: JOINT REPLACEMENT No date: KNEE SURGERY No date: ovariectomy 2015: right foot surgery for overlapping toes 2000: stomach gastric bypass No date: TONSILLECTOMY 12/02/2020: TOTAL KNEE ARTHROPLASTY; Left  BMI    Body Mass Index: 34.39 kg/m      Reproductive/Obstetrics negative OB ROS                             Anesthesia Physical Anesthesia Plan  ASA: 2  Anesthesia Plan: MAC   Post-op Pain Management:    Induction: Intravenous  PONV Risk Score and Plan:   Airway Management Planned: Natural Airway and Nasal Cannula  Additional Equipment:   Intra-op Plan:   Post-operative Plan:   Informed Consent: I have reviewed the patients History and Physical, chart, labs and discussed the procedure including the risks, benefits and alternatives for the proposed anesthesia with the patient or  authorized representative who has indicated his/her understanding and acceptance.     Dental Advisory Given  Plan Discussed with: Anesthesiologist, CRNA and Surgeon  Anesthesia Plan Comments: (Patient consented for risks of anesthesia including but not limited to:  - adverse reactions to medications - damage to eyes, teeth, lips or other oral mucosa - nerve damage due to positioning  - sore throat or hoarseness - Damage to heart, brain, nerves, lungs, other parts of body or loss of life  Patient voiced understanding.)       Anesthesia Quick Evaluation

## 2022-11-06 NOTE — Op Note (Signed)
OPERATIVE NOTE  Priscilla Savage [id number] 11/06/2022   PREOPERATIVE DIAGNOSIS: Nuclear sclerotic cataract right eye. H25.11   POSTOPERATIVE DIAGNOSIS: Nuclear sclerotic cataract right eye. H25.11   PROCEDURE:  Phacoemusification with posterior chamber intraocular lens placement of the right eye  Ultrasound time: Procedure(s) with comments: CATARACT EXTRACTION PHACO AND INTRAOCULAR LENS PLACEMENT (IOC) RIGHT (Right) - 6.91 0:48.1  LENS:   Implant Name Type Inv. Item Serial No. Manufacturer Lot No. LRB No. Used Action  LENS IOL TECNIS EYHANCE 22.5 - [id number] Intraocular Lens LENS IOL TECNIS EYHANCE 22.5 [id number] [employer]  Right 1 Implanted      SURGEON:  Courtney Heys. Lazarus Salines, MD   ANESTHESIA:  Topical with tetracaine drops, augmented with 1% preservative-free intracameral lidocaine.   COMPLICATIONS:  None.   DESCRIPTION OF PROCEDURE:  The patient was identified in the holding room and transported to the operating room and placed in the supine position under the operating microscope.  The right eye was identified as the operative eye, which was prepped and draped in the usual sterile ophthalmic fashion.   A 1 millimeter clear-corneal paracentesis was made superotemporally. Preservative-free 1% lidocaine mixed with 1:1,000 bisulfite-free aqueous solution of epinephrine was injected into the anterior chamber. The anterior chamber was then filled with Viscoat viscoelastic. A 2.4 millimeter keratome was used to make a clear-corneal incision inferotemporally. A curvilinear capsulorrhexis was made with a cystotome and capsulorrhexis forceps. Balanced salt solution was used to hydrodissect and hydrodelineate the nucleus. Phacoemulsification was then used to remove the lens nucleus and epinucleus. The remaining cortex was then removed using the irrigation and aspiration handpiece. Provisc was then placed into the capsular bag to distend it for lens placement. A +22.50 D DIB00 intraocular  lens was then injected into the capsular bag. The remaining viscoelastic was aspirated.   Wounds were hydrated with balanced salt solution.  The anterior chamber was inflated to a physiologic pressure with balanced salt solution.  No wound leaks were noted. Vigamox was injected intracamerally.  Timolol and Brimonidine drops were applied to the eye.  The patient was taken to the recovery room in stable condition without complications of anesthesia or surgery.  [name] [name] 11/06/2022, 10:49 AM

## 2022-11-06 NOTE — Transfer of Care (Signed)
Immediate Anesthesia Transfer of Care Note  Patient: Priscilla Savage  Procedure(s) Performed: CATARACT EXTRACTION PHACO AND INTRAOCULAR LENS PLACEMENT (IOC) RIGHT (Right: Eye)  Patient Location: PACU  Anesthesia Type: MAC  Level of Consciousness: awake, alert  and patient cooperative  Airway and Oxygen Therapy: Patient Spontanous Breathing and Patient connected to supplemental oxygen  Post-op Assessment: Post-op Vital signs reviewed, Patient's Cardiovascular Status Stable, Respiratory Function Stable, Patent Airway and No signs of Nausea or vomiting  Post-op Vital Signs: Reviewed and stable  Complications: No notable events documented.

## 2022-11-06 NOTE — Anesthesia Postprocedure Evaluation (Signed)
Anesthesia Post Note  Patient: Priscilla Savage  Procedure(s) Performed: CATARACT EXTRACTION PHACO AND INTRAOCULAR LENS PLACEMENT (IOC) RIGHT (Right: Eye)  Patient location during evaluation: PACU Anesthesia Type: MAC Level of consciousness: awake and alert Pain management: pain level controlled Vital Signs Assessment: post-procedure vital signs reviewed and stable Respiratory status: spontaneous breathing, nonlabored ventilation, respiratory function stable and patient connected to nasal cannula oxygen Cardiovascular status: stable and blood pressure returned to baseline Postop Assessment: no apparent nausea or vomiting Anesthetic complications: no  No notable events documented.   Last Vitals:  Vitals:   11/06/22 1049 11/06/22 1054  BP: (!) 141/92 (!) 142/94  Pulse: (!) 47 66  Resp: 14 14  Temp: (!) 36.2 C (!) 36.2 C  SpO2: 98% 100%    Last Pain:  Vitals:   11/06/22 1054  TempSrc:   PainSc: 0-No pain                 Dimas Millin

## 2022-11-06 NOTE — H&P (Signed)
[facility]   Primary Care Physician:  Abran Richard, MD Ophthalmologist: Dr. Merleen Nicely  Pre-Procedure History & Physical: HPI:  Priscilla Savage is a 77 y.o. female here for cataract surgery.   Past Medical History:  Diagnosis Date   GERD (gastroesophageal reflux disease)    Hypertension    Vertigo 07/2021   had epley Maneuver.  None since    Past Surgical History:  Procedure Laterality Date   ABDOMINAL HYSTERECTOMY     APPENDECTOMY     eyelid surgery Bilateral    JOINT REPLACEMENT     KNEE SURGERY     ovariectomy     right foot surgery for overlapping toes  2015   stomach gastric bypass  2000   TONSILLECTOMY     TOTAL KNEE ARTHROPLASTY Left 12/02/2020    Prior to Admission medications   Medication Sig Start Date End Date Taking? Authorizing Provider  amLODipine (NORVASC) 10 MG tablet TAKE 1 TABLET BY MOUTH ONCE  DAILY for 100 04/08/20  Yes [provider]  atenolol (TENORMIN) 50 MG tablet Take 50 mg by mouth daily.   Yes [provider]  hydrochlorothiazide (MICROZIDE) 12.5 MG capsule Take 12.5 mg by mouth daily.   Yes [provider]  losartan (COZAAR) 100 MG tablet Take 100 mg by mouth daily.   Yes [provider]  Multiple Vitamin (MULTIVITAMIN) tablet Take 1 tablet by mouth daily.   Yes [provider]  oxybutynin (DITROPAN-XL) 5 MG 24 hr tablet 1 tablet Orally Once a day for 90 days   Yes [provider]  pantoprazole (PROTONIX) 40 MG tablet Take 40 mg by mouth daily.   Yes [provider]    Allergies as of 10/24/2022 - Review Complete 07/24/2022  Allergen Reaction Noted   Oxycodone-acetaminophen Palpitations 07/24/2022    History reviewed. No pertinent family history.  Social History   Socioeconomic History   Marital status: Single    Spouse name: Not on file   Number of children: Not on file   Years of education: Not on file   Highest education level: Not on file   Occupational History   Not on file  Tobacco Use   Smoking status: Never   Smokeless tobacco: Never  Vaping Use   Vaping Use: Never used  Substance and Sexual Activity   Alcohol use: Yes    Comment: Rare   Drug use: Not on file   Sexual activity: Not on file  Other Topics Concern   Not on file  Social History Narrative   ** Merged History Encounter **       Social Determinants of Health   Financial Resource Strain: Not on file  Food Insecurity: Not on file  Transportation Needs: Not on file  Physical Activity: Not on file  Stress: Not on file  Social Connections: Not on file  Intimate Partner Violence: Not on file    Review of Systems: See HPI, otherwise negative ROS  Physical Exam: Ht 5\' 7"  (1.702 m)   Wt 96.6 kg   BMI 33.36 kg/m  General:   Alert, cooperative in NAD Head:  Normocephalic and atraumatic. Respiratory:  Normal work of breathing. Cardiovascular:  RRR  Impression/Plan: Priscilla Savage is here for cataract surgery.  Risks, benefits, limitations, and alternatives regarding cataract surgery have been reviewed with the patient.  Questions have been answered.  All parties agreeable.   Norvel Richards, MD  11/06/2022, 7:08 AM

## 2022-11-07 ENCOUNTER — Encounter: Payer: Self-pay | Admitting: Ophthalmology

## 2022-11-22 ENCOUNTER — Encounter: Payer: Self-pay | Admitting: Ophthalmology

## 2022-11-22 NOTE — Anesthesia Preprocedure Evaluation (Addendum)
Anesthesia Evaluation  Patient identified by MRN, date of birth, ID band Patient awake    Reviewed: Allergy & Precautions, H&P , NPO status , Patient's Chart, lab work & pertinent test results  Airway Mallampati: I  TM Distance: >3 FB Neck ROM: Full    Dental  (+) Lower Dentures, Upper Dentures   Pulmonary neg pulmonary ROS   Pulmonary exam normal breath sounds clear to auscultation       Cardiovascular hypertension, Normal cardiovascular exam Rhythm:Regular Rate:Normal     Neuro/Psych vertigo negative neurological ROS  negative psych ROS   GI/Hepatic Neg liver ROS,GERD  Controlled,,  Endo/Other  negative endocrine ROS    Renal/GU negative Renal ROS  negative genitourinary   Musculoskeletal  (+) Arthritis , Osteoarthritis,    Abdominal   Peds negative pediatric ROS (+)  Hematology negative hematology ROS (+)   Anesthesia Other Findings Hypertension  GERD (gastroesophageal reflux disease) Vertigo  Pitting edema both lower legs +2   Reproductive/Obstetrics negative OB ROS                              Anesthesia Physical Anesthesia Plan  ASA: 3  Anesthesia Plan: MAC   Post-op Pain Management:    Induction: Intravenous  PONV Risk Score and Plan:   Airway Management Planned: Natural Airway and Nasal Cannula  Additional Equipment:   Intra-op Plan:   Post-operative Plan:   Informed Consent: I have reviewed the patients History and Physical, chart, labs and discussed the procedure including the risks, benefits and alternatives for the proposed anesthesia with the patient or authorized representative who has indicated his/her understanding and acceptance.     Dental Advisory Given  Plan Discussed with: Anesthesiologist, CRNA and Surgeon  Anesthesia Plan Comments: (Patient consented for risks of anesthesia including but not limited to:  - adverse reactions to  medications - damage to eyes, teeth, lips or other oral mucosa - nerve damage due to positioning  - sore throat or hoarseness - Damage to heart, brain, nerves, lungs, other parts of body or loss of life  Patient voiced understanding.)         Anesthesia Quick Evaluation

## 2022-11-28 NOTE — Discharge Instructions (Signed)

## 2022-11-30 ENCOUNTER — Ambulatory Visit
Admission: RE | Admit: 2022-11-30 | Discharge: 2022-11-30 | Disposition: A | Discharge status: Home / Self Care | Payer: 59 | Attending: Ophthalmology | Admitting: Ophthalmology

## 2022-11-30 ENCOUNTER — Other Ambulatory Visit: Payer: Self-pay

## 2022-11-30 ENCOUNTER — Ambulatory Visit: Payer: 59 | Admitting: Anesthesiology

## 2022-11-30 ENCOUNTER — Encounter
Admission: RE | Disposition: A | Discharge status: Home / Self Care | Payer: Self-pay | Source: Home / Self Care | Attending: Ophthalmology

## 2022-11-30 ENCOUNTER — Encounter: Payer: Self-pay | Admitting: Ophthalmology

## 2022-11-30 DIAGNOSIS — M1990 Unspecified osteoarthritis, unspecified site: Secondary | ICD-10-CM | POA: Diagnosis not present

## 2022-11-30 DIAGNOSIS — Z9884 Bariatric surgery status: Secondary | ICD-10-CM | POA: Diagnosis not present

## 2022-11-30 DIAGNOSIS — Z9841 Cataract extraction status, right eye: Secondary | ICD-10-CM | POA: Diagnosis not present

## 2022-11-30 DIAGNOSIS — Z961 Presence of intraocular lens: Secondary | ICD-10-CM | POA: Diagnosis not present

## 2022-11-30 DIAGNOSIS — R42 Dizziness and giddiness: Secondary | ICD-10-CM | POA: Insufficient documentation

## 2022-11-30 DIAGNOSIS — I10 Essential (primary) hypertension: Secondary | ICD-10-CM | POA: Diagnosis not present

## 2022-11-30 DIAGNOSIS — R609 Edema, unspecified: Secondary | ICD-10-CM | POA: Diagnosis not present

## 2022-11-30 DIAGNOSIS — K219 Gastro-esophageal reflux disease without esophagitis: Secondary | ICD-10-CM | POA: Diagnosis not present

## 2022-11-30 DIAGNOSIS — H2512 Age-related nuclear cataract, left eye: Secondary | ICD-10-CM | POA: Diagnosis not present

## 2022-11-30 HISTORY — PX: CATARACT EXTRACTION W/PHACO: SHX586

## 2022-11-30 SURGERY — PHACOEMULSIFICATION, CATARACT, WITH IOL INSERTION
Anesthesia: Monitor Anesthesia Care | Site: Eye | Laterality: Left

## 2022-11-30 MED ORDER — LIDOCAINE HCL (PF) 2 % IJ SOLN
INTRAOCULAR | Status: DC | PRN
  Administered 2022-11-30: 1 mL via INTRAOCULAR

## 2022-11-30 MED ORDER — MIDAZOLAM HCL 2 MG/2ML IJ SOLN
INTRAMUSCULAR | Status: DC | PRN
  Administered 2022-11-30: 1 mg via INTRAVENOUS

## 2022-11-30 MED ORDER — ARMC OPHTHALMIC DILATING DROPS
1.0000 | OPHTHALMIC | Status: DC | PRN
  Administered 2022-11-30 (×3): 1 via OPHTHALMIC

## 2022-11-30 MED ORDER — TETRACAINE HCL 0.5 % OP SOLN
1.0000 [drp] | OPHTHALMIC | Status: DC | PRN
  Administered 2022-11-30 (×3): 1 [drp] via OPHTHALMIC

## 2022-11-30 MED ORDER — FENTANYL CITRATE (PF) 100 MCG/2ML IJ SOLN
INTRAMUSCULAR | Status: DC | PRN
  Administered 2022-11-30: 50 ug via INTRAVENOUS

## 2022-11-30 MED ORDER — SIGHTPATH DOSE#1 NA HYALUR & NA CHOND-NA HYALUR IO KIT
PACK | INTRAOCULAR | Status: DC | PRN
  Administered 2022-11-30: 1 via OPHTHALMIC

## 2022-11-30 MED ORDER — SIGHTPATH DOSE#1 BSS IO SOLN
INTRAOCULAR | Status: DC | PRN
  Administered 2022-11-30: 30 mL

## 2022-11-30 MED ORDER — SIGHTPATH DOSE#1 BSS IO SOLN
INTRAOCULAR | Status: DC | PRN
  Administered 2022-11-30: 64 mL via OPHTHALMIC

## 2022-11-30 SURGICAL SUPPLY — 22 items
BNDG EYE OVAL 2 1/8 X 2 5/8 (GAUZE/BANDAGES/DRESSINGS) IMPLANT
CANNULA ANT/CHMB 27G (MISCELLANEOUS) IMPLANT
CANNULA ANT/CHMB 27GA (MISCELLANEOUS) IMPLANT
CATARACT SUITE SIGHTPATH (MISCELLANEOUS) ×1 IMPLANT
DISSECTOR HYDRO NUCLEUS 50X22 (MISCELLANEOUS) ×1 IMPLANT
DRSG TEGADERM 2-3/8X2-3/4 SM (GAUZE/BANDAGES/DRESSINGS) ×1 IMPLANT
FEE CATARACT SUITE SIGHTPATH (MISCELLANEOUS) ×1 IMPLANT
GLOVE SURG SYN 7.5  E (GLOVE) ×1
GLOVE SURG SYN 7.5 E (GLOVE) ×1 IMPLANT
GLOVE SURG SYN 7.5 PF PI (GLOVE) ×1 IMPLANT
GLOVE SURG SYN 8.5  E (GLOVE) ×1
GLOVE SURG SYN 8.5 E (GLOVE) ×1 IMPLANT
GLOVE SURG SYN 8.5 PF PI (GLOVE) ×1 IMPLANT
LENS IOL TECNIS EYHANCE 23.0 (Intraocular Lens) IMPLANT
NDL FILTER BLUNT 18X1 1/2 (NEEDLE) IMPLANT
NEEDLE FILTER BLUNT 18X1 1/2 (NEEDLE) IMPLANT
PACK VIT ANT 23G (MISCELLANEOUS) IMPLANT
RING MALYGIN 7.0 (MISCELLANEOUS) IMPLANT
SUT ETHILON 10-0 CS-B-6CS-B-6 (SUTURE)
SUTURE EHLN 10-0 CS-B-6CS-B-6 (SUTURE) IMPLANT
SYR 3ML LL SCALE MARK (SYRINGE) IMPLANT
SYR 5ML LL (SYRINGE) IMPLANT

## 2022-11-30 NOTE — H&P (Signed)
[facility]   Primary Care Physician:  Alvina Filbert, MD Ophthalmologist: Dr. Deberah Pelton  Pre-Procedure History & Physical: HPI:  Priscilla Savage is a 77 y.o. female here for cataract surgery.   Past Medical History:  Diagnosis Date   GERD (gastroesophageal reflux disease)    Hypertension    Vertigo 07/2021   had epley Maneuver.  None since    Past Surgical History:  Procedure Laterality Date   ABDOMINAL HYSTERECTOMY     APPENDECTOMY     CATARACT EXTRACTION W/PHACO Right 11/06/2022   Procedure: CATARACT EXTRACTION PHACO AND INTRAOCULAR LENS PLACEMENT (IOC) RIGHT;  Surgeon: Estanislado Pandy, MD;  Location: [facility];  Service: Ophthalmology;  Laterality: Right;  6.91 0:48.1   eyelid surgery Bilateral    JOINT REPLACEMENT     KNEE SURGERY     ovariectomy     right foot surgery for overlapping toes  2015   stomach gastric bypass  2000   TONSILLECTOMY     TOTAL KNEE ARTHROPLASTY Left 12/02/2020    Prior to Admission medications   Medication Sig Start Date End Date Taking? Authorizing Provider  amLODipine (NORVASC) 10 MG tablet TAKE 1 TABLET BY MOUTH ONCE  DAILY for 100 04/08/20   [provider]  atenolol (TENORMIN) 50 MG tablet Take 50 mg by mouth daily.    [provider]  hydrochlorothiazide (MICROZIDE) 12.5 MG capsule Take 12.5 mg by mouth daily.    [provider]  losartan (COZAAR) 100 MG tablet Take 100 mg by mouth daily.    [provider]  Multiple Vitamin (MULTIVITAMIN) tablet Take 1 tablet by mouth daily.    [provider]  oxybutynin (DITROPAN-XL) 5 MG 24 hr tablet 1 tablet Orally Once a day for 90 days    [provider]  pantoprazole (PROTONIX) 40 MG tablet Take 40 mg by mouth daily.    [provider]    Allergies as of 11/08/2022 - Review Complete 11/06/2022  Allergen Reaction Noted   Lisinopril Cough 10/26/2022   Quetiapine  10/26/2022   Oxycodone-acetaminophen  Palpitations 07/24/2022    History reviewed. No pertinent family history.  Social History   Socioeconomic History   Marital status: Single    Spouse name: Not on file   Number of children: Not on file   Years of education: Not on file   Highest education level: Not on file  Occupational History   Not on file  Tobacco Use   Smoking status: Never   Smokeless tobacco: Never  Vaping Use   Vaping Use: Never used  Substance and Sexual Activity   Alcohol use: Yes    Comment: Rare   Drug use: Not on file   Sexual activity: Not on file  Other Topics Concern   Not on file  Social History Narrative   ** Merged History Encounter **       Social Determinants of Health   Financial Resource Strain: Not on file  Food Insecurity: Not on file  Transportation Needs: Not on file  Physical Activity: Not on file  Stress: Not on file  Social Connections: Not on file  Intimate Partner Violence: Not on file    Review of Systems: See HPI, otherwise negative ROS  Physical Exam: Ht  (1.702 m)   Wt 99.6 kg   BMI 34.39 kg/m  General:   Alert, cooperative in NAD Head:  Normocephalic and atraumatic. Respiratory:  Normal work of breathing. Cardiovascular:  RRR  Impression/Plan:  Priscilla Savage is here for cataract surgery.  Risks, benefits, limitations, and alternatives regarding cataract surgery have been reviewed with the patient.  Questions have been answered.  All parties agreeable.   Estanislado Pandy, MD  11/30/2022, 11:36 AM

## 2022-11-30 NOTE — Anesthesia Postprocedure Evaluation (Signed)
Anesthesia Post Note  Patient: Priscilla Savage  Procedure(s) Performed: CATARACT EXTRACTION PHACO AND INTRAOCULAR LENS PLACEMENT (IOC) LEFT (Left: Eye)  Patient location during evaluation: PACU Anesthesia Type: MAC Level of consciousness: awake and alert Pain management: pain level controlled Vital Signs Assessment: post-procedure vital signs reviewed and stable Respiratory status: spontaneous breathing, nonlabored ventilation, respiratory function stable and patient connected to nasal cannula oxygen Cardiovascular status: stable and blood pressure returned to baseline Postop Assessment: no apparent nausea or vomiting Anesthetic complications: no   No notable events documented.   Last Vitals:  Vitals:   11/30/22 1425 11/30/22 1430  BP: (!) 151/89 (!) 162/80  Pulse: (!) 52 (!) 56  Resp: 18 15  Temp:  (!) 36.4 C  SpO2: 100% 100%    Last Pain:  Vitals:   11/30/22 1430  TempSrc:   PainSc: 0-No pain                 Birdell Frasier C [name]

## 2022-11-30 NOTE — Op Note (Signed)
OPERATIVE NOTE  Priscilla Savage [id number] 11/30/2022   PREOPERATIVE DIAGNOSIS: Nuclear sclerotic cataract left eye. H25.12   POSTOPERATIVE DIAGNOSIS: Nuclear sclerotic cataract left eye. H25.12   PROCEDURE:  Phacoemusification with posterior chamber intraocular lens placement of the left eye  Ultrasound time: Procedure(s) with comments: CATARACT EXTRACTION PHACO AND INTRAOCULAR LENS PLACEMENT (IOC) LEFT (Left) - 10.37  01:06.4  LENS:  * No implants in log *    SURGEON:  Julious Payer. Rolley Sims, MD   ANESTHESIA:  Topical with tetracaine drops, augmented with 1% preservative-free intracameral lidocaine.   COMPLICATIONS:  None.   DESCRIPTION OF PROCEDURE:  The patient was identified in the holding room and transported to the operating room and placed in the supine position under the operating microscope.  The left eye was identified as the operative eye, which was prepped and draped in the usual sterile ophthalmic fashion.   A 1 millimeter clear-corneal paracentesis was made inferotemporally. Preservative-free 1% lidocaine mixed with 1:1,000 bisulfite-free aqueous solution of epinephrine was injected into the anterior chamber. The anterior chamber was then filled with Viscoat viscoelastic. A 2.4 millimeter keratome was used to make a clear-corneal incision superotemporally. A curvilinear capsulorrhexis was made with a cystotome and capsulorrhexis forceps. Balanced salt solution was used to hydrodissect and hydrodelineate the nucleus. Phacoemulsification was then used to remove the lens nucleus and epinucleus. The remaining cortex was then removed using the irrigation and aspiration handpiece. Provisc was then placed into the capsular bag to distend it for lens placement. A +23.00 D DIB00 intraocular lens was then injected into the capsular bag. The remaining viscoelastic was aspirated.   Wounds were hydrated with balanced salt solution.  The anterior chamber was inflated to a physiologic  pressure with balanced salt solution.  No wound leaks were noted. Vigamox was injected intracamerally.  Timolol and Brimonidine drops were applied to the eye.  The patient was taken to the recovery room in stable condition without complications of anesthesia or surgery.  [name] 11/30/2022, 2:20 PM

## 2022-11-30 NOTE — Transfer of Care (Signed)
Immediate Anesthesia Transfer of Care Note  Patient: Priscilla Savage  Procedure(s) Performed: CATARACT EXTRACTION PHACO AND INTRAOCULAR LENS PLACEMENT (IOC) LEFT (Left: Eye)  Patient Location: PACU  Anesthesia Type: MAC  Level of Consciousness: awake, alert  and patient cooperative  Airway and Oxygen Therapy: Patient Spontanous Breathing and Patient connected to supplemental oxygen  Post-op Assessment: Post-op Vital signs reviewed, Patient's Cardiovascular Status Stable, Respiratory Function Stable, Patent Airway and No signs of Nausea or vomiting  Post-op Vital Signs: Reviewed and stable  Complications: No notable events documented.

## 2022-12-01 ENCOUNTER — Encounter: Payer: Self-pay | Admitting: Ophthalmology

## 2023-02-28 ENCOUNTER — Other Ambulatory Visit: Payer: Self-pay | Admitting: Internal Medicine

## 2023-02-28 DIAGNOSIS — Z1231 Encounter for screening mammogram for malignant neoplasm of breast: Secondary | ICD-10-CM

## 2023-03-02 ENCOUNTER — Other Ambulatory Visit: Payer: Self-pay | Admitting: *Deleted

## 2023-03-02 ENCOUNTER — Inpatient Hospital Stay
Admission: RE | Admit: 2023-03-02 | Discharge: 2023-03-02 | Disposition: A | Discharge status: Home / Self Care | Payer: Self-pay | Source: Ambulatory Visit | Attending: Internal Medicine | Admitting: Internal Medicine

## 2023-03-02 DIAGNOSIS — Z1231 Encounter for screening mammogram for malignant neoplasm of breast: Secondary | ICD-10-CM

## 2023-03-06 ENCOUNTER — Ambulatory Visit
Admission: RE | Admit: 2023-03-06 | Discharge: 2023-03-06 | Disposition: A | Discharge status: Home / Self Care | Payer: 59 | Source: Ambulatory Visit | Attending: Internal Medicine | Admitting: Internal Medicine

## 2023-03-06 DIAGNOSIS — Z1231 Encounter for screening mammogram for malignant neoplasm of breast: Secondary | ICD-10-CM
